# Patient Record
Sex: Female | Born: 1975 | Race: Black or African American | Hispanic: No | Marital: Single | Smoking: Current every day smoker
Health system: Southern US, Community
[De-identification: ages and names within clinical notes are randomized; demographics above are authoritative.]

## PROBLEM LIST (undated history)

## (undated) HISTORY — PX: OTHER SURGICAL HISTORY: SHX169

---

## 2007-10-18 ENCOUNTER — Observation Stay: Payer: Self-pay | Admitting: Obstetrics and Gynecology

## 2007-11-11 ENCOUNTER — Ambulatory Visit: Payer: Self-pay | Admitting: Obstetrics and Gynecology

## 2007-11-14 ENCOUNTER — Inpatient Hospital Stay: Payer: Self-pay | Admitting: Obstetrics and Gynecology

## 2008-10-11 ENCOUNTER — Emergency Department: Payer: Self-pay | Admitting: Emergency Medicine

## 2010-03-06 ENCOUNTER — Emergency Department: Payer: Self-pay | Admitting: Emergency Medicine

## 2010-10-17 ENCOUNTER — Ambulatory Visit
Admission: RE | Admit: 2010-10-17 | Discharge: 2010-10-17 | Payer: Self-pay | Source: Home / Self Care | Attending: Internal Medicine | Admitting: Internal Medicine

## 2010-10-17 DIAGNOSIS — J309 Allergic rhinitis, unspecified: Secondary | ICD-10-CM | POA: Insufficient documentation

## 2010-10-17 DIAGNOSIS — H6980 Other specified disorders of Eustachian tube, unspecified ear: Secondary | ICD-10-CM | POA: Insufficient documentation

## 2010-11-06 NOTE — Assessment & Plan Note (Signed)
Summary: SORE THROAT/JBB   Vital Signs:  Patient Profile:   35 Years Old Female CC:      Sore Throat Height:     63.5 inches Weight:      107 pounds BMI:     18.72 O2 Sat:      100 % O2 treatment:    Room Air Temp:     97.2 degrees F oral Pulse rate:   80 / minute Pulse rhythm:   regular Resp:     18 per minute BP sitting:   131 / 87  (right arm)  Pt. in pain?   yes    Location:   neck    Intensity:   6    Type:       aching  Vitals Entered By: Levonne Spiller EMT-P (October 17, 2010 4:50 PM)              Is Patient Diabetic? No  Does patient need assistance? Functional Status Self care Ambulation Normal Comments Pt. is smoker. 2 cigs per day.      Current Allergies: No known allergies History of Present Illness Chief Complaint: Sore Throat x 1 week History of Present Illness: patient with allergies but nasal discharge has become more yellow lately.  sore throat is more "burning" today than previous. 36 yo daughter was put on omnicef for ear infection yesterday. patient denies f/c, aches, fatigue, face pain  REVIEW OF SYSTEMS Constitutional Symptoms      Denies fever, chills, night sweats, weight loss, weight gain, and fatigue.  Eyes       Denies change in vision, eye pain, eye discharge, glasses, contact lenses, and eye surgery. Ear/Nose/Throat/Mouth       Complains of ear pain, frequent runny nose, sinus problems, sore throat, and hoarseness.      Denies hearing loss/aids, change in hearing, ear discharge, dizziness, and frequent nose bleeds.      Comments: mild right ear pressure. Respiratory       Complains of dry cough and shortness of breath.      Denies productive cough, wheezing, asthma, bronchitis, and emphysema/COPD.      Comments: chronic mild sob Cardiovascular       Denies murmurs, chest pain, and tires easily with exhertion.    Gastrointestinal       Denies stomach pain, nausea/vomiting, diarrhea, constipation, blood in bowel movements, and  indigestion. Genitourniary       Denies painful urination, blood or discharge from vagina, kidney stones, and loss of urinary control. Neurological       Denies paralysis, seizures, and fainting/blackouts. Musculoskeletal       Denies muscle pain, joint pain, joint stiffness, decreased range of motion, redness, swelling, muscle weakness, and gout.  Skin       Denies bruising, unusual mles/lumps or sores, and hair/skin or nail changes.  Psych       Denies mood changes, temper/anger issues, anxiety/stress, speech problems, depression, and sleep problems.  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 no sinus tenderness Eyes: conjunctivae and lids normal Ears: non-mobile TMR Nasal: pale, boggy, swollen nasal turbinates Oral/Pharynx: tongue normal, posterior pharynx without erythema or exudate. marked dental caries and fractures Neck: neck supple,  trachea midline, no nodes Chest/Lungs: no rales, wheezes, or rhonchi bilateral, breath sounds equal  without effort Neurological: grossly intact and non-focal Skin: no obvious rashes MSE: oriented to time, place, and person Assessment New Problems: ALLERGIC RHINITIS (ICD-477.9) DYSFUNCTION OF EUSTACHIAN TUBE (ICD-381.81) UPPER RESPIRATORY INFECTION, ACUTE (ICD-465.9)   Plan  The patient and/or caregiver has been counseled thoroughly with regard to medications prescribed including dosage, schedule, interactions, rationale for use, and possible side effects and they verbalize understanding.  Diagnoses and expected course of recovery discussed and will return if not improved as expected or if the condition worsens. Patient and/or caregiver verbalized understanding.   Patient Instructions: 1)  rapid  strep test offered and declined. 2)  generic claritin D twice daily 3)  afrin generic 2 sprays in each nostril every 12 hours for 3 days maximum followed by 3 days of non-use. Continue cycle as needed to control nasal or ear congestion  4)  Take 650-1000mg  of Tylenol every 4-6 hours as needed for relief of pain or comfort of fever AVOID taking more than 4000mg   in a 24 hour period (can cause liver damage in higher doses). 5)  Take 400-600mg  of Ibuprofen (Advil, Motrin) with food every 4-6 hours as needed for relief of pain or comfort of fever. 6)  gargles, lozenges. 7)  steam. 8)  check temps, return if worse or not well in 9 days.

## 2010-12-26 IMAGING — US US OB < 14 WEEKS - US OB TV
1 series · 17 of 28 positions shown · non-contrast
Comparison: none

REASON FOR EXAM: 15 hrs of vaginal bleeding and mid abd pain
COMMENTS:

[Series 1: us ob < 14 weeks - us ob tv · 17 of 39 slices shown]
[im 1/39]
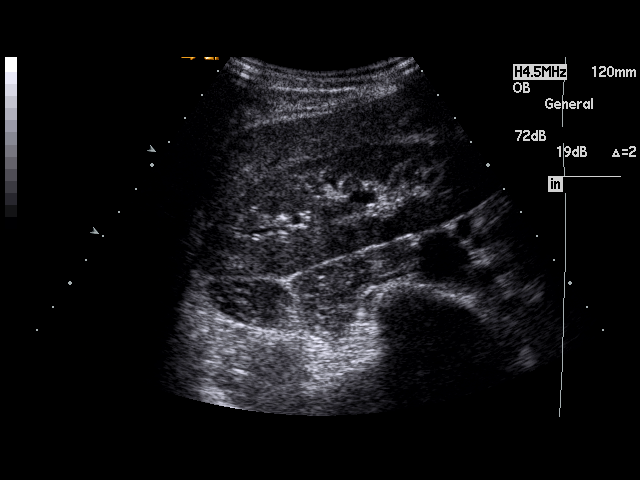
[im 3/39]
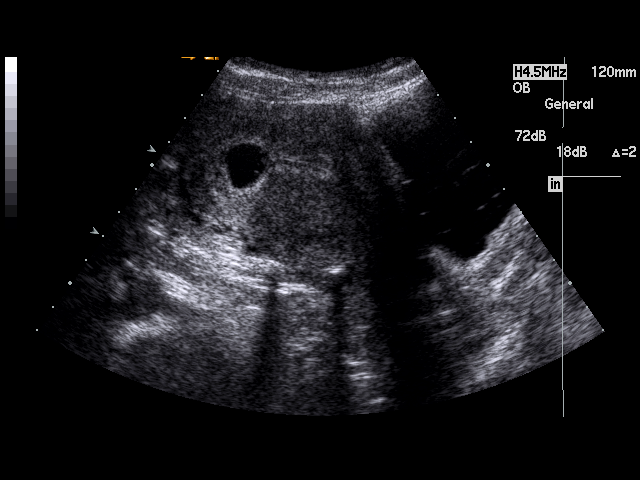
[im 6/39]
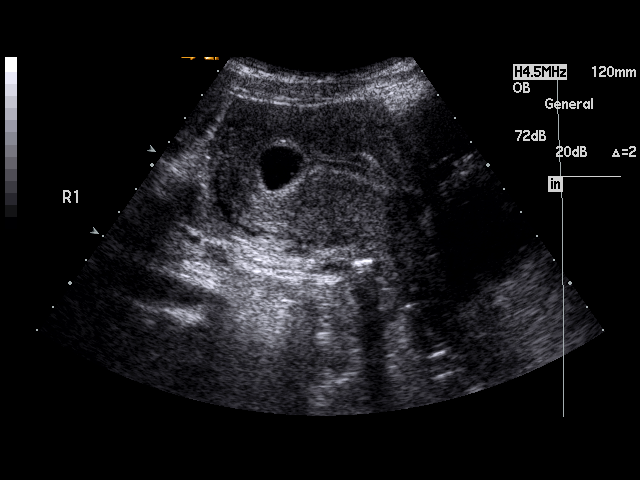
[im 8/39]
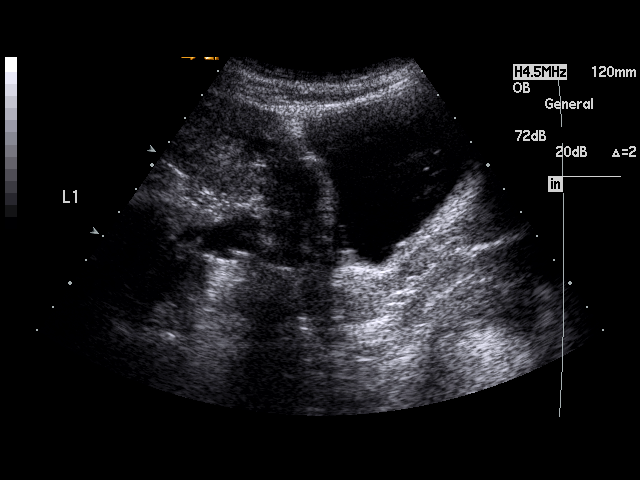
[im 10/39]
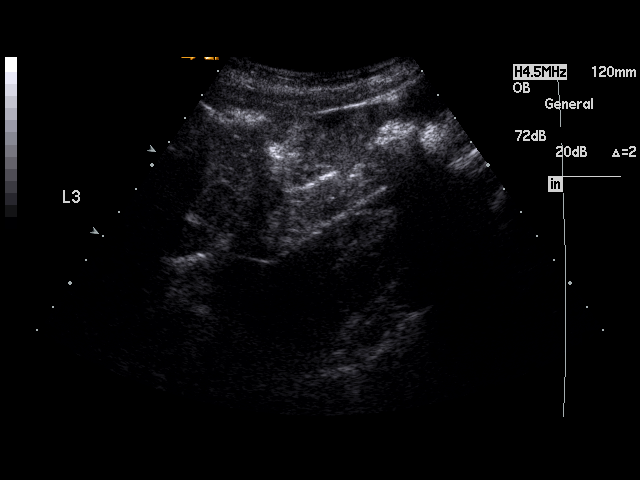
[im 13/39]
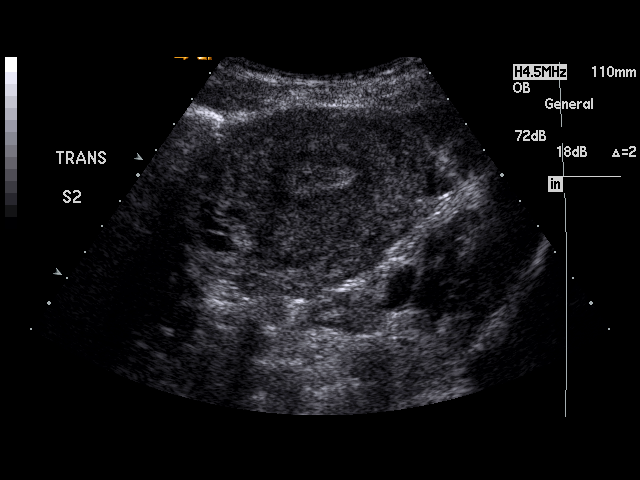
[im 15/39]
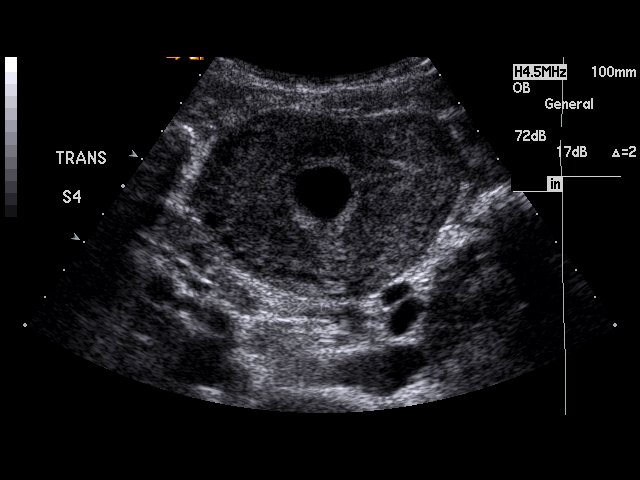
[im 17/39]
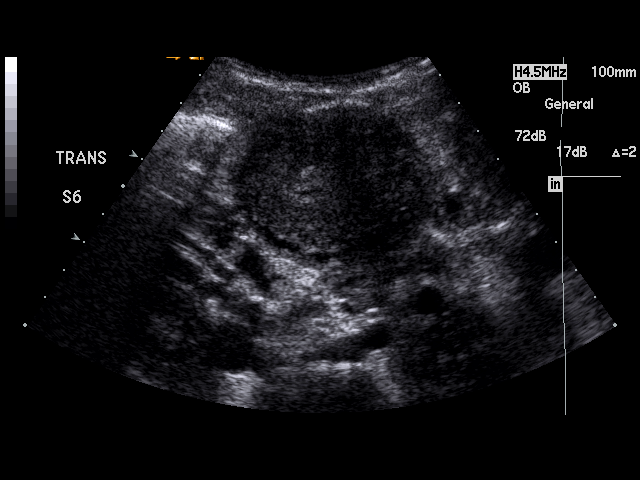
[im 20/39]
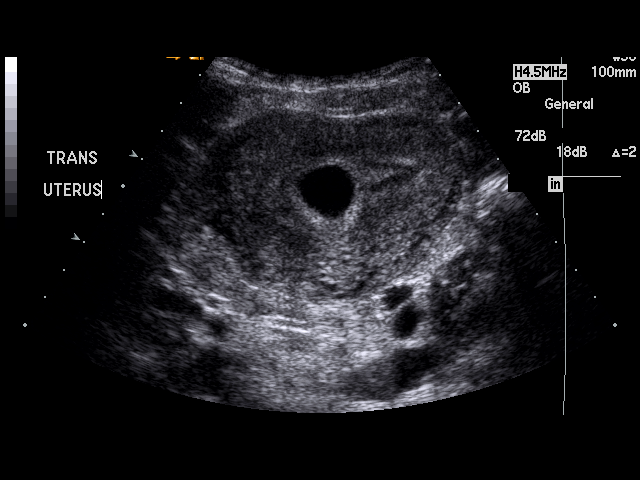
[im 22/39]
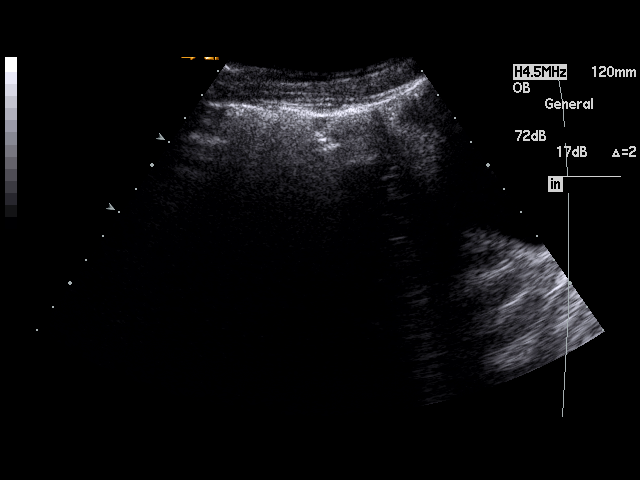
[im 24/39]
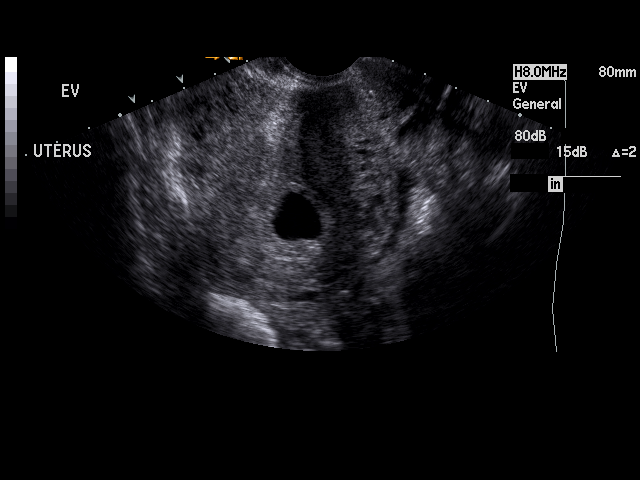
[im 26/39]
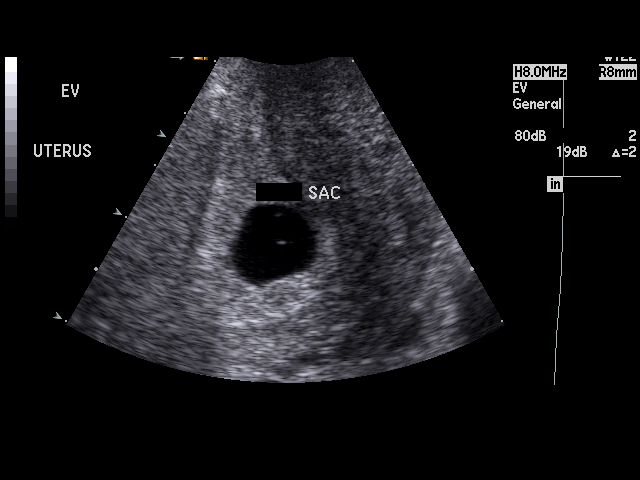
[im 29/39]
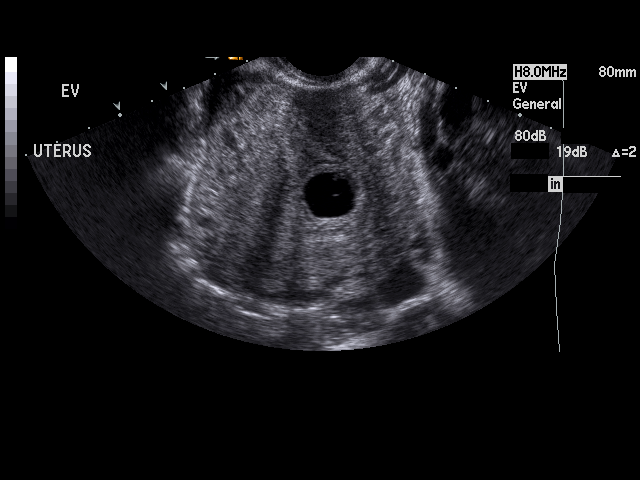
[im 31/39]
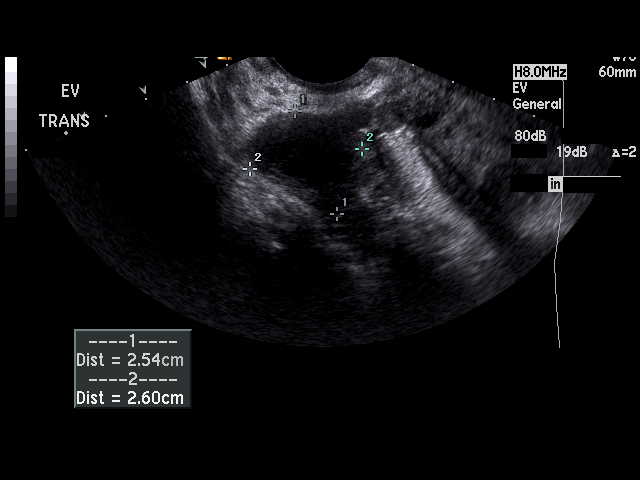
[im 33/39]
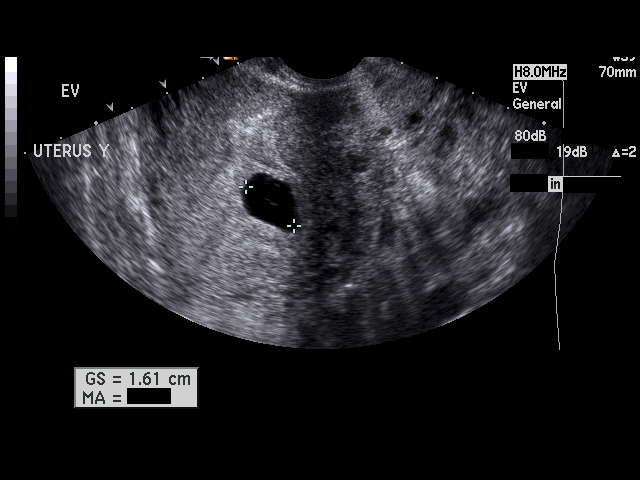
[im 36/39]
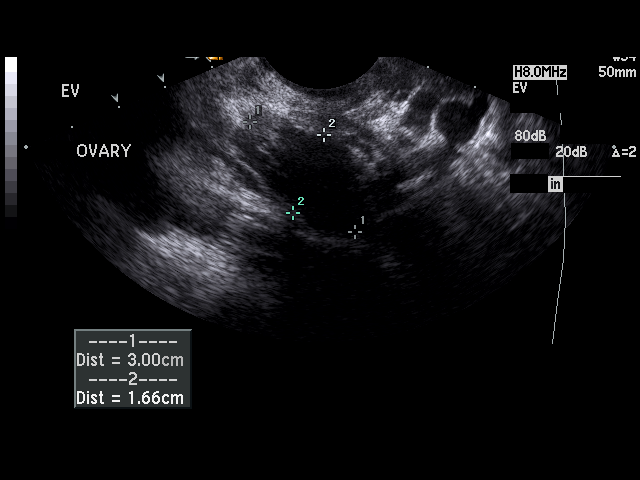
[im 39/39]
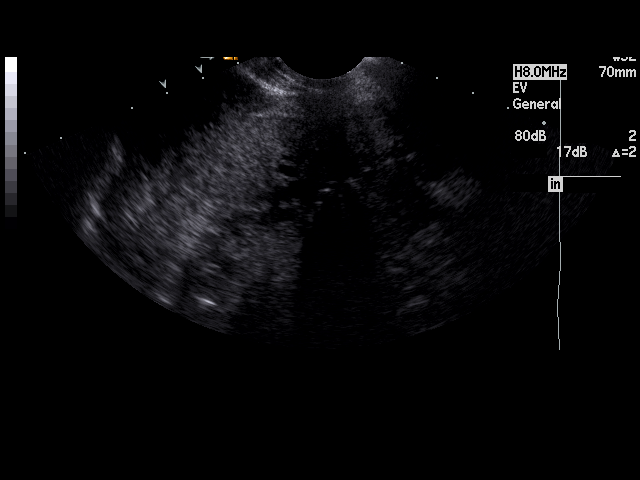

[17 of 28 positions shown; findings below may reference images not displayed]

PROCEDURE:     US  - US OB LESS THAN 14 WEEKS/W TRANS  - March 07, 2010  [DATE]

RESULT:     Transabdominal and endovaginal pelvic sonogram shows the kidneys
appear to be grossly normal. There is an intrauterine fluid collection
suggestive of a gestational sac. A Foley balloon is seen within the urinary
bladder. There appears to be a yolk sac contained within the gestational
sac. No fetal pole is identified. The ovaries appear to be normal in size
and echotexture. Gestational sac diameter demonstrates a mean measurement of
1.44 cm consistent with a 5 week-7 day intrauterine gestation. Again, no
fetal pole is evident.
IMPRESSION: There appears to be an intrauterine gestation without fetal
pole visualized at this point. Correlate clinically and with continued
laboratories assessment of quantitative beta-hCG. Follow-up ultrasound is
recommended to assess for fetal development and viability.

## 2017-08-24 ENCOUNTER — Ambulatory Visit: Payer: Self-pay | Admitting: Internal Medicine

## 2018-04-22 ENCOUNTER — Other Ambulatory Visit: Payer: Self-pay

## 2018-04-22 ENCOUNTER — Encounter: Payer: Self-pay | Admitting: Emergency Medicine

## 2018-04-22 ENCOUNTER — Emergency Department
Admission: EM | Admit: 2018-04-22 | Discharge: 2018-04-22 | Disposition: A | Discharge status: Home / Self Care | Payer: BLUE CROSS/BLUE SHIELD | Attending: Emergency Medicine | Admitting: Emergency Medicine

## 2018-04-22 ENCOUNTER — Emergency Department: Payer: BLUE CROSS/BLUE SHIELD

## 2018-04-22 DIAGNOSIS — Y92009 Unspecified place in unspecified non-institutional (private) residence as the place of occurrence of the external cause: Secondary | ICD-10-CM | POA: Insufficient documentation

## 2018-04-22 DIAGNOSIS — Y939 Activity, unspecified: Secondary | ICD-10-CM | POA: Insufficient documentation

## 2018-04-22 DIAGNOSIS — S99921A Unspecified injury of right foot, initial encounter: Secondary | ICD-10-CM | POA: Diagnosis present

## 2018-04-22 DIAGNOSIS — X58XXXA Exposure to other specified factors, initial encounter: Secondary | ICD-10-CM | POA: Diagnosis not present

## 2018-04-22 DIAGNOSIS — S92511A Displaced fracture of proximal phalanx of right lesser toe(s), initial encounter for closed fracture: Secondary | ICD-10-CM | POA: Diagnosis not present

## 2018-04-22 DIAGNOSIS — Y999 Unspecified external cause status: Secondary | ICD-10-CM | POA: Diagnosis not present

## 2018-04-22 MED ORDER — MELOXICAM 15 MG PO TABS: 15.0000 mg | ORAL_TABLET | Freq: Every day | ORAL | 0 refills | Status: DC

## 2018-04-22 NOTE — ED Provider Notes (Signed)
[name] Emergency Department Provider Note  ____________________________________________  Time seen: Approximately 11:41 PM  I have reviewed the triage vital signs and the nursing notes.   HISTORY  Chief Complaint Foot Injury    HPI Kristina Mckenzie is a 42 y.o. female who presents the emergency department complaining of pain and injury to the right little toe.  Per the patient, 1 of her dogs ran outside and she was chasing him through the house.  Patient reports that she stubbed her toe on a rolled up roll of carpet.  Patient reports immediate pain.  She was able to ambulate up placing the majority of the weight on the medial foot.  No other injury or complaint.  No medications prior to arrival.    History reviewed. No pertinent past medical history.  Patient Active Problem List   Diagnosis Date Noted  . DYSFUNCTION OF EUSTACHIAN TUBE 10/17/2010  . ALLERGIC RHINITIS 10/17/2010    History reviewed. No pertinent surgical history.  Prior to Admission medications   Medication Sig Start Date End Date Taking? Authorizing Provider  meloxicam (MOBIC) 15 MG tablet Take 1 tablet (15 mg total) by mouth daily. 04/22/18   Cuthriell, Charline Bills, PA-C    Allergies Patient has no allergy information on record.  No family history on file.  Social History Social History   Tobacco Use  . Smoking status: Not on file  Substance Use Topics  . Alcohol use: Not on file  . Drug use: Not on file     Review of Systems  Constitutional: No fever/chills Eyes: No visual changes.  Cardiovascular: no chest pain. Respiratory: no cough. No SOB. Gastrointestinal: No abdominal pain.  No nausea, no vomiting.   Musculoskeletal: Positive for pain and injury to the fifth digit of the right foot. Skin: Negative for rash, abrasions, lacerations, ecchymosis. Neurological: Negative for headaches, focal weakness or numbness. 10-point ROS otherwise  negative.  ____________________________________________   PHYSICAL EXAM:  VITAL SIGNS: ED Triage Vitals  Enc Vitals Group     BP 04/22/18 2144 111/71     Pulse Rate 04/22/18 2144 95     Resp 04/22/18 2144 18     Temp 04/22/18 2144 99 F (37.2 C)     Temp Source 04/22/18 2144 Oral     SpO2 04/22/18 2144 97 %     Weight 04/22/18 2143 103 lb (46.7 kg)     Height 04/22/18 2143 5\' 3"  (1.6 m)     Head Circumference --      Peak Flow --      Pain Score 04/22/18 2144 8     Pain Loc --      Pain Edu? --      Excl. in [place]? --      Constitutional: Alert and oriented. Well appearing and in no acute distress. Eyes: Conjunctivae are normal. PERRL. EOMI. Head: Atraumatic. Neck: No stridor.    Cardiovascular: Normal rate, regular rhythm. Normal S1 and S2.  Good peripheral circulation. Respiratory: Normal respiratory effort without tachypnea or retractions. Lungs CTAB. Good air entry to the bases with no decreased or absent breath sounds. Musculoskeletal: Full range of motion to all extremities. No gross deformities appreciated.  There is deformity, edema, ecchymosis noted to the little digit of the right foot.  No other visible abnormality.  Patient is extremely tender to palpation of the proximal phalanx with no palpable abnormality.  Sensation and cap refill intact distally.  No other tenderness to palpation of the osseous  structures of the foot. Neurologic:  Normal speech and language. No gross focal neurologic deficits are appreciated.  Skin:  Skin is warm, dry and intact. No rash noted. Psychiatric: Mood and affect are normal. Speech and behavior are normal. Patient exhibits appropriate insight and judgement.   ____________________________________________   LABS (all labs ordered are listed, but only abnormal results are displayed)  Labs Reviewed - No data to display ____________________________________________  EKG   ____________________________________________  RADIOLOGY I  personally viewed and evaluated these images as part of my medical decision making, as well as reviewing the written report by the radiologist.  I concur with radiologist finding of comminuted, oblique fracture that is mildly displaced to the proximal phalanx little toe right foot.  Dg Foot Complete Right  Result Date: 04/22/2018 CLINICAL DATA:  Right 5th toe pain and swelling to RT foot states she tripped over a rug at home. No obvious deformity noted, pt co pain on movement and ambulation. EXAM: RIGHT FOOT COMPLETE - 3+ VIEW COMPARISON:  None. FINDINGS: Mildly comminuted primarily oblique fracture of the proximal phalanx of the small toe. The fracture extends from the lateral-proximal metaphysis to the medial-distal metaphysis, also with a transverse component extending from the fracture plane to the medial cortex in the midshaft. There is up to about 1 mm of displacement. No additional fractures are identified. The flattened appearance of the longitudinal arch of the foot may simply be projectional on the lateral view, which was not obtained with the patient standing. IMPRESSION: 1. Mildly comminuted oblique fracture of the proximal phalanx small toe. Electronically Signed   By: Van Clines M.D.   On: 04/22/2018 22:21    ____________________________________________    PROCEDURES  Procedure(s) performed:    Procedures    Medications - No data to display   ____________________________________________   INITIAL IMPRESSION / ASSESSMENT AND PLAN / ED COURSE  Pertinent labs & imaging results that were available during my care of the patient were reviewed by me and considered in my medical decision making (see chart for details).  Review of the [place] CSRS was performed in accordance of the [place] prior to dispensing any controlled drugs.      Patient's diagnosis is consistent with fracture of the proximal phalanx of the fifth digit right foot.  Patient presented to the emergency  department with injury and pain to the little digit of the right foot.  Exam revealed mild edema and tenderness to palpation.  X-ray reveals comminuted, oblique fracture.  Patient is given postop shoe and crutches for ambulation.  Meloxicam will be prescribed for symptom control.  Patient will follow with podiatry.. Patient is given ED precautions to return to the ED for any worsening or new symptoms.     ____________________________________________  FINAL CLINICAL IMPRESSION(S) / ED DIAGNOSES  Final diagnoses:  Closed displaced fracture of proximal phalanx of lesser toe of right foot, initial encounter      NEW MEDICATIONS STARTED DURING THIS VISIT:  ED Discharge Orders        Ordered    meloxicam (MOBIC) 15 MG tablet  Daily     04/22/18 2310          This chart was dictated using voice recognition software/Dragon. Despite best efforts to proofread, errors can occur which can change the meaning. Any change was purely unintentional.    Darletta Moll, PA-C 04/22/18 [place], [place], MD 04/25/18 2200

## 2018-04-22 NOTE — ED Triage Notes (Signed)
Reports going through house quickly and hit right foot on rolled up carpet.

## 2018-04-22 NOTE — ED Notes (Signed)
Pt in with co right 5th toe pain states she tripped over a rug at home. No obvious deformity noted, pt co pain on movement and ambulation.

## 2018-04-22 NOTE — ED Notes (Signed)
Patient discharged to home per MD order. Patient in stable condition, and deemed medically cleared by ED provider for discharge. Discharge instructions reviewed with patient/family using "Teach Back"; verbalized understanding of medication education and administration, and information about follow-up care. Denies further concerns. ° °

## 2018-04-29 ENCOUNTER — Ambulatory Visit: Payer: Self-pay | Admitting: Podiatry

## 2019-02-09 ENCOUNTER — Ambulatory Visit (INDEPENDENT_AMBULATORY_CARE_PROVIDER_SITE_OTHER): Payer: BLUE CROSS/BLUE SHIELD | Admitting: Obstetrics & Gynecology

## 2019-02-09 ENCOUNTER — Encounter: Payer: Self-pay | Admitting: Obstetrics & Gynecology

## 2019-02-09 ENCOUNTER — Other Ambulatory Visit: Payer: Self-pay

## 2019-02-09 VITALS — BP 100/60 | Ht 63.0 in | Wt 104.0 lb

## 2019-02-09 DIAGNOSIS — D219 Benign neoplasm of connective and other soft tissue, unspecified: Secondary | ICD-10-CM

## 2019-02-09 DIAGNOSIS — N920 Excessive and frequent menstruation with regular cycle: Secondary | ICD-10-CM | POA: Diagnosis not present

## 2019-02-09 NOTE — Patient Instructions (Signed)

## 2019-02-09 NOTE — Progress Notes (Signed)
Uterine Fibroids Patient is a 43 yo [id number] F who presents with uterine fibroids as suggested by her PCP and by her sx's.  She has had known fibroids from her pregnancy days (small, last pregnancy 11 years ago). Periods used to be regular every 26 days, lasting 5 days.  Over the last 2 years they have worsened to every 30-32 days but heavy, 8 days flow, crampy, associated with nausea and headache, and even has had to miss work because of them.  Dysmenorrhea:moderate, occurring throughout menses. Cyclic symptoms include headache and neausea. No intermenstrual bleeding, spotting, or discharge.  Menarche age 72 3 CS, one VBAC Not currently sexually active Contraception is abstinance Does not desire future pregnancy  Last PAP 01/04/2019 at PCP, has had normal PAPs over years  PMHx: She  has no past medical history on file. Also,  has a past surgical history that includes csection., family history includes Cancer in her maternal grandmother; Diabetes in her maternal grandmother.,  reports that she has been smoking. She has never used smokeless tobacco. She reports current drug use. Drug: Marijuana. She reports that she does not drink alcohol.   Works at [employer]  She has a current medication list which includes the following prescription(s): meloxicam. Also, has no allergies on file.  Review of Systems  Constitutional: Negative for chills, fever and malaise/fatigue.  HENT: Negative for congestion, sinus pain and sore throat.   Eyes: Negative for blurred vision and pain.  Respiratory: Negative for cough and wheezing.   Cardiovascular: Negative for chest pain and leg swelling.  Gastrointestinal: Negative for abdominal pain, constipation, diarrhea, heartburn, nausea and vomiting.  Genitourinary: Negative for dysuria, frequency, hematuria and urgency.  Musculoskeletal: Negative for back pain, joint pain, myalgias and neck pain.  Skin: Negative for itching and rash.   Neurological: Negative for dizziness, tremors and weakness.  Endo/Heme/Allergies: Does not bruise/bleed easily.  Psychiatric/Behavioral: Negative for depression. The patient is not nervous/anxious and does not have insomnia.     Objective: BP 100/60   Ht 5\' 3"  (1.6 m)   Wt 104 lb (47.2 kg)   LMP 01/14/2019   BMI 18.42 kg/m  Physical Exam Constitutional:      General: She is not in acute distress.    Appearance: She is well-developed.  Genitourinary:     Pelvic exam was performed with patient supine.     Vagina, cervix and rectum normal.     No lesions in the vagina.     No vaginal bleeding.     No cervical polyp.     Uterus is enlarged, irregular and mobile.     Uterine mass present.    Uterus is midaxial and globular.     No right or left adnexal mass present.     Right adnexa not tender.     Left adnexa not tender.     Genitourinary Comments: 12 week size uterus  HENT:     Head: Normocephalic and atraumatic. No laceration.     Right Ear: Hearing normal.     Left Ear: Hearing normal.     Mouth/Throat:     Pharynx: Uvula midline.  Eyes:     Pupils: Pupils are equal, round, and reactive to light.  Neck:     Musculoskeletal: Normal range of motion and neck supple.     Thyroid: No thyromegaly.  Cardiovascular:     Rate and Rhythm: Normal rate and regular rhythm.     Heart sounds: No murmur. No friction  rub. No gallop.   Pulmonary:     Effort: Pulmonary effort is normal. No respiratory distress.     Breath sounds: Normal breath sounds. No wheezing.  Abdominal:     General: Bowel sounds are normal. There is no distension.     Palpations: Abdomen is soft.     Tenderness: There is no abdominal tenderness. There is no rebound.  Musculoskeletal: Normal range of motion.  Neurological:     Mental Status: She is alert and oriented to person, place, and time.     Cranial Nerves: No cranial nerve deficit.  Skin:    General: Skin is warm and dry.  Psychiatric:         Judgment: Judgment normal.  Vitals signs reviewed.     ASSESSMENT/PLAN:  New problem with Menorrhagia; Fibroid growth  Problem List Items Addressed This Visit      Other   Menorrhagia with regular cycle - Primary   Relevant Orders   US PELVIC COMPLETE WITH TRANSVAGINAL   Fibroid   Relevant Orders   US PELVIC COMPLETE WITH TRANSVAGINAL    Fibroid treatment such as [place], Lupron, Myomectomy, and Hysterectomy discussed in detail, with the pros and cons of each choice counseled.  No treatment as an option also discussed, as well as control of symptoms alone with hormone therapy. Information provided to the patient.  A total of 60 minutes were spent face-to-face with the patient during this encounter and over half of that time dealt with counseling and coordination of care.  Barnett Applebaum, MD, [facility], [facility] 02/09/2019  10:05 AM

## 2019-02-10 IMAGING — DX DG FOOT COMPLETE 3+V*R*
3 series · 3 of 3 positions shown · non-contrast
Comparison: None.

CLINICAL DATA: Right 5th toe pain and swelling to RT foot states
she tripped over a rug at home. No obvious deformity noted, pt co
pain on movement and ambulation.

EXAM:
RIGHT FOOT COMPLETE - 3+ VIEW

[foot ap]
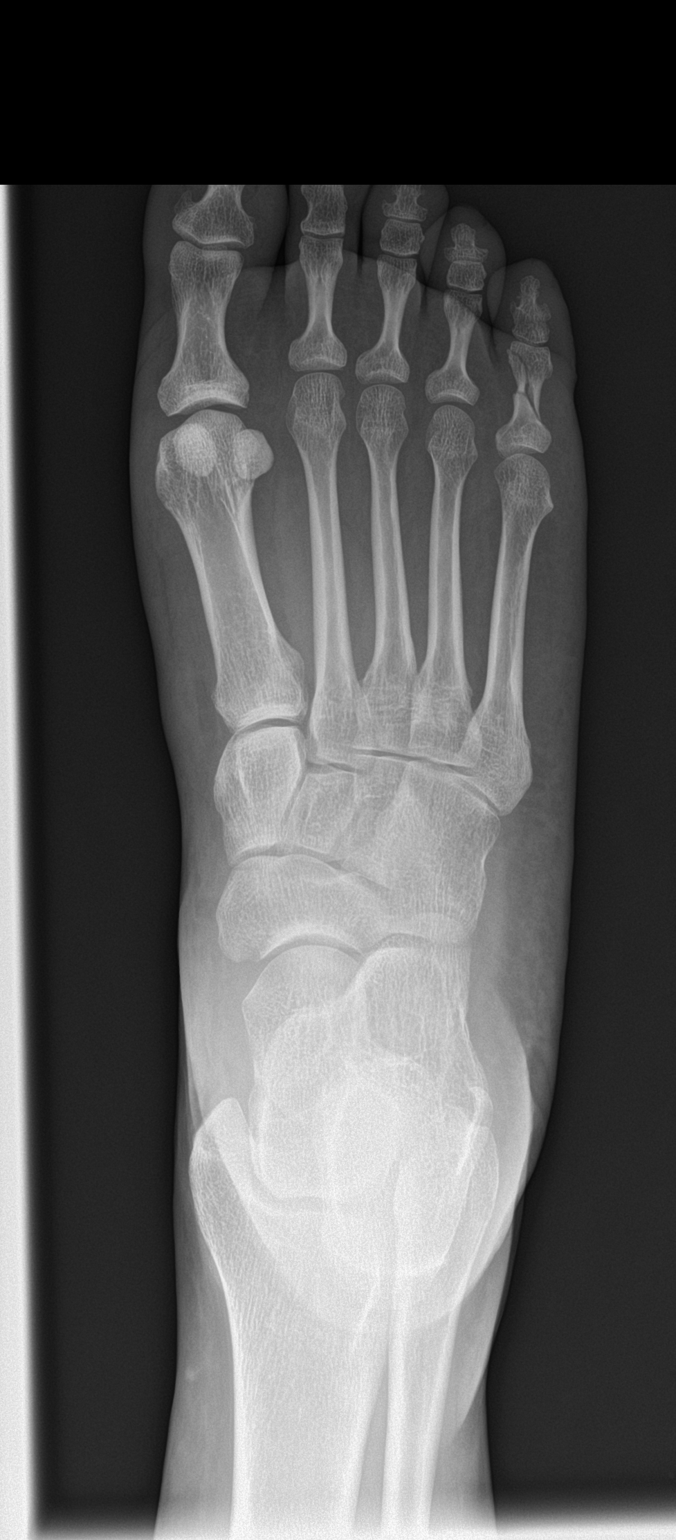

[foot obl]
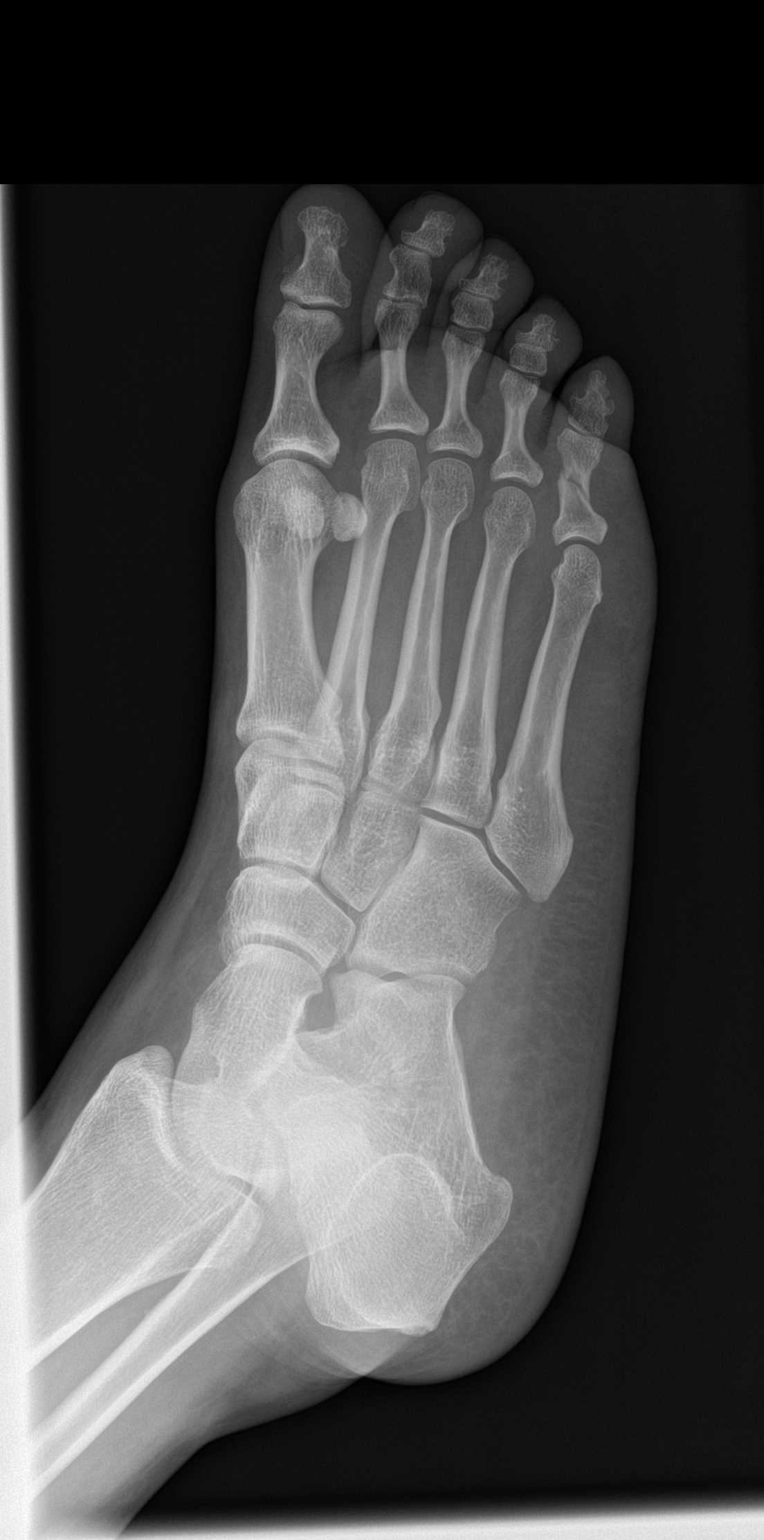

[foot lat]
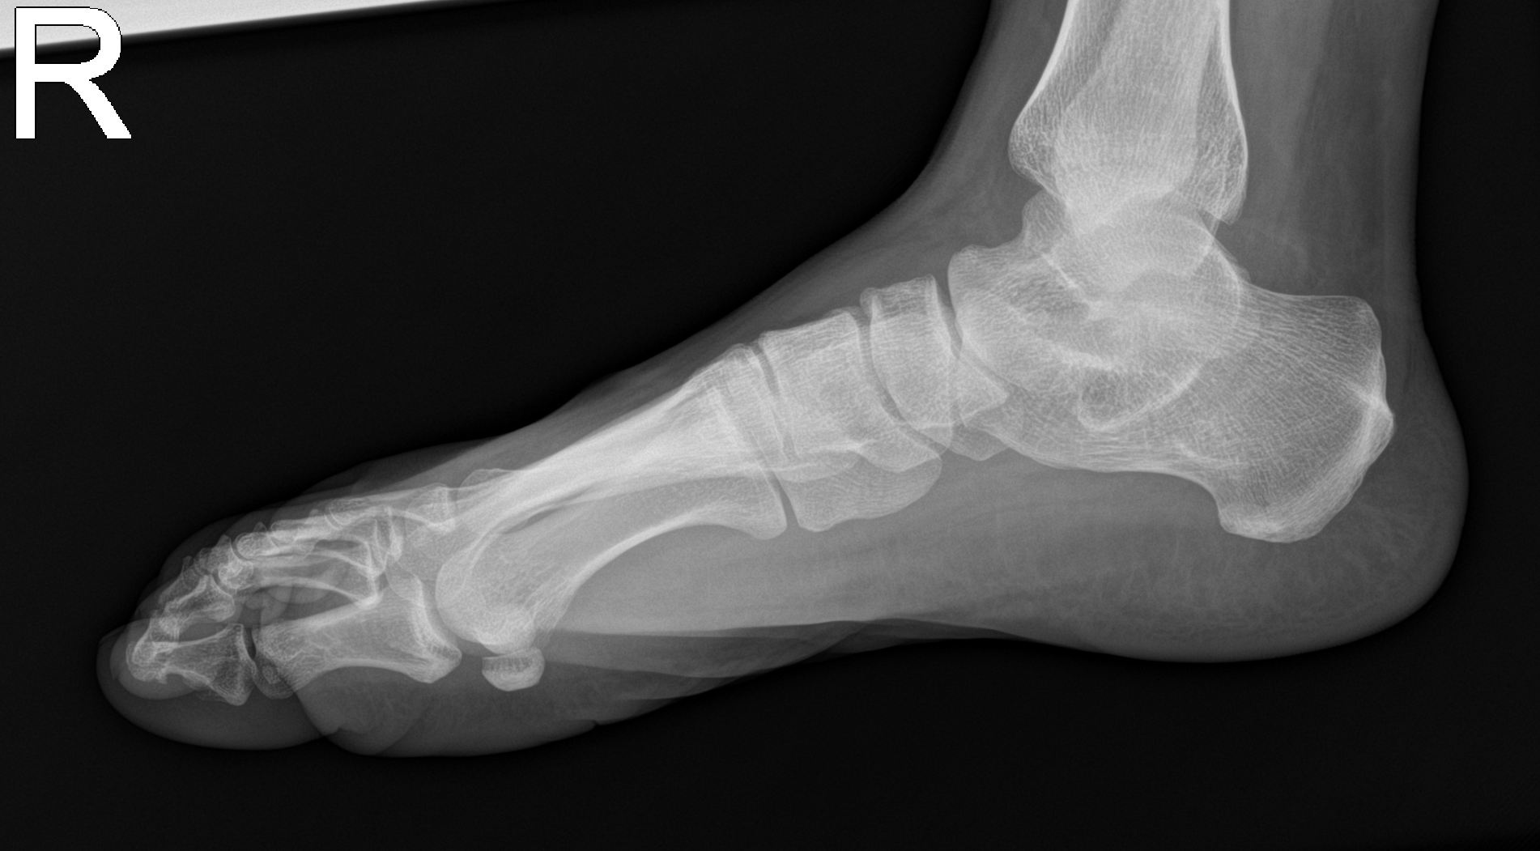

[3 of 3 positions shown; findings below may reference images not displayed]

FINDINGS: Mildly comminuted primarily oblique fracture of the proximal phalanx
of the small toe. The fracture extends from the lateral-proximal
metaphysis to the medial-distal metaphysis, also with a transverse
component extending from the fracture plane to the medial cortex in
the midshaft. There is up to about 1 mm of displacement. No
additional fractures are identified.

The flattened appearance of the longitudinal arch of the foot may
simply be projectional on the lateral view, which was not obtained
with the patient standing.
IMPRESSION: 1. Mildly comminuted oblique fracture of the proximal phalanx small
toe.

## 2019-02-14 ENCOUNTER — Ambulatory Visit (INDEPENDENT_AMBULATORY_CARE_PROVIDER_SITE_OTHER): Payer: BLUE CROSS/BLUE SHIELD

## 2019-02-14 ENCOUNTER — Encounter: Payer: Self-pay | Admitting: Obstetrics & Gynecology

## 2019-02-14 ENCOUNTER — Ambulatory Visit (INDEPENDENT_AMBULATORY_CARE_PROVIDER_SITE_OTHER): Payer: BLUE CROSS/BLUE SHIELD | Admitting: Obstetrics & Gynecology

## 2019-02-14 ENCOUNTER — Other Ambulatory Visit: Payer: Self-pay

## 2019-02-14 VITALS — BP 100/60 | Ht 63.0 in | Wt 104.0 lb

## 2019-02-14 DIAGNOSIS — D219 Benign neoplasm of connective and other soft tissue, unspecified: Secondary | ICD-10-CM

## 2019-02-14 DIAGNOSIS — D251 Intramural leiomyoma of uterus: Secondary | ICD-10-CM

## 2019-02-14 DIAGNOSIS — N920 Excessive and frequent menstruation with regular cycle: Secondary | ICD-10-CM

## 2019-02-14 DIAGNOSIS — N83202 Unspecified ovarian cyst, left side: Secondary | ICD-10-CM | POA: Diagnosis not present

## 2019-02-14 NOTE — Patient Instructions (Signed)
Levonorgestrel intrauterine device (IUD) What is this medicine? LEVONORGESTREL IUD (LEE voe nor jes trel) is a contraceptive (birth control) device. The device is placed inside the uterus by a healthcare professional. It is used to prevent pregnancy. This device can also be used to treat heavy bleeding that occurs during your period. This medicine may be used for other purposes; ask your health care provider or pharmacist if you have questions. COMMON BRAND NAME(S): Minette Headland What should I tell my health care provider before I take this medicine? They need to know if you have any of these conditions: -abnormal Pap smear -cancer of the breast, uterus, or cervix -diabetes -endometritis -genital or pelvic infection now or in the past -have more than one sexual partner or your partner has more than one partner -heart disease -history of an ectopic or tubal pregnancy -immune system problems -IUD in place -liver disease or tumor -problems with blood clots or take blood-thinners -seizures -use intravenous drugs -uterus of unusual shape -vaginal bleeding that has not been explained -an unusual or allergic reaction to levonorgestrel, other hormones, silicone, or polyethylene, medicines, foods, dyes, or preservatives -pregnant or trying to get pregnant -breast-feeding How should I use this medicine? This device is placed inside the uterus by a health care professional. Talk to your pediatrician regarding the use of this medicine in children. Special care may be needed. Overdosage: If you think you have taken too much of this medicine contact a poison control center or emergency room at once. NOTE: This medicine is only for you. Do not share this medicine with others. What if I miss a dose? This does not apply. Depending on the brand of device you have inserted, the device will need to be replaced every 3 to 5 years if you wish to continue using this type of birth control.  What may interact with this medicine? Do not take this medicine with any of the following medications: -amprenavir -bosentan -fosamprenavir This medicine may also interact with the following medications: -aprepitant -armodafinil -barbiturate medicines for inducing sleep or treating seizures -bexarotene -boceprevir -griseofulvin -medicines to treat seizures like carbamazepine, ethotoin, felbamate, oxcarbazepine, phenytoin, topiramate -modafinil -pioglitazone -rifabutin -rifampin -rifapentine -some medicines to treat HIV infection like atazanavir, efavirenz, indinavir, lopinavir, nelfinavir, tipranavir, ritonavir -St. John's wort -warfarin This list may not describe all possible interactions. Give your health care provider a list of all the medicines, herbs, non-prescription drugs, or dietary supplements you use. Also tell them if you smoke, drink alcohol, or use illegal drugs. Some items may interact with your medicine. What should I watch for while using this medicine? Visit your doctor or health care professional for regular check ups. See your doctor if you or your partner has sexual contact with others, becomes HIV positive, or gets a sexual transmitted disease. This product does not protect you against HIV infection (AIDS) or other sexually transmitted diseases. You can check the placement of the IUD yourself by reaching up to the top of your vagina with clean fingers to feel the threads. Do not pull on the threads. It is a good habit to check placement after each menstrual period. Call your doctor right away if you feel more of the IUD than just the threads or if you cannot feel the threads at all. The IUD may come out by itself. You may become pregnant if the device comes out. If you notice that the IUD has come out use a backup birth control method like condoms and call your  health care provider. Using tampons will not change the position of the IUD and are okay to use during your  period. This IUD can be safely scanned with magnetic resonance imaging (MRI) only under specific conditions. Before you have an MRI, tell your healthcare provider that you have an IUD in place, and which type of IUD you have in place. What side effects may I notice from receiving this medicine? Side effects that you should report to your doctor or health care professional as soon as possible: -allergic reactions like skin rash, itching or hives, swelling of the face, lips, or tongue -fever, flu-like symptoms -genital sores -high blood pressure -no menstrual period for 6 weeks during use -pain, swelling, warmth in the leg -pelvic pain or tenderness -severe or sudden headache -signs of pregnancy -stomach cramping -sudden shortness of breath -trouble with balance, talking, or walking -unusual vaginal bleeding, discharge -yellowing of the eyes or skin Side effects that usually do not require medical attention (report to your doctor or health care professional if they continue or are bothersome): -acne -breast pain -change in sex drive or performance -changes in weight -cramping, dizziness, or faintness while the device is being inserted -headache -irregular menstrual bleeding within first 3 to 6 months of use -nausea This list may not describe all possible side effects. Call your doctor for medical advice about side effects. You may report side effects to FDA at 1-800-FDA-1088. Where should I keep my medicine? This does not apply. NOTE: This sheet is a summary. It may not cover all possible information. If you have questions about this medicine, talk to your doctor, pharmacist, or health care provider.  2019 Elsevier/Gold Standard (2016-07-03 14:14:56)   Endometrial Ablation Endometrial ablation is a procedure that destroys the thin inner layer of the lining of the uterus (endometrium). This procedure may be done:  To stop heavy periods.  To stop bleeding that is causing anemia.   To control irregular bleeding.  To treat bleeding caused by small tumors (fibroids) in the endometrium. This procedure is often an alternative to major surgery, such as removal of the uterus and cervix (hysterectomy). As a result of this procedure:  You may not be able to have children. However, if you are premenopausal (you have not gone through menopause): ? You may still have a small chance of getting pregnant. ? You will need to use a reliable method of birth control after the procedure to prevent pregnancy.  You may stop having a menstrual period, or you may have only a small amount of bleeding during your period. Menstruation may return several years after the procedure. Tell a health care provider about:  Any allergies you have.  All medicines you are taking, including vitamins, herbs, eye drops, creams, and over-the-counter medicines.  Any problems you or family members have had with the use of anesthetic medicines.  Any blood disorders you have.  Any surgeries you have had.  Any medical conditions you have. What are the risks? Generally, this is a safe procedure. However, problems may occur, including:  A hole (perforation) in the uterus or bowel.  Infection of the uterus, bladder, or vagina.  Bleeding.  Damage to other structures or organs.  An air bubble in the lung (air embolus).  Problems with pregnancy after the procedure.  Failure of the procedure.  Decreased ability to diagnose cancer in the endometrium. What happens before the procedure?  You will have tests of your endometrium to make sure there are no pre-cancerous cells  or cancer cells present.  You may have an ultrasound of the uterus.  You may be given medicines to thin the endometrium.  Ask your health care provider about: ? Changing or stopping your regular medicines. This is especially important if you take diabetes medicines or blood thinners. ? Taking medicines such as aspirin and  ibuprofen. These medicines can thin your blood. Do not take these medicines before your procedure if your doctor tells you not to.  Plan to have someone take you home from the hospital or clinic. What happens during the procedure?   You will lie on an exam table with your feet and legs supported as in a pelvic exam.  To lower your risk of infection: ? Your health care team will wash or sanitize their hands and put on germ-free (sterile) gloves. ? Your genital area will be washed with soap.  An IV tube will be inserted into one of your veins.  You will be given a medicine to help you relax (sedative).  A surgical instrument with a light and camera (resectoscope) will be inserted into your vagina and moved into your uterus. This allows your surgeon to see inside your uterus.  Endometrial tissue will be removed using one of the following methods: ? Radiofrequency. This method uses a radiofrequency-alternating electric current to remove the endometrium. ? Cryotherapy. This method uses extreme cold to freeze the endometrium. ? Heated-free liquid. This method uses a heated saltwater (saline) solution to remove the endometrium. ? Microwave. This method uses high-energy microwaves to heat up the endometrium and remove it. ? Thermal balloon. This method involves inserting a catheter with a balloon tip into the uterus. The balloon tip is filled with heated fluid to remove the endometrium. The procedure may vary among health care providers and hospitals. What happens after the procedure?  Your blood pressure, heart rate, breathing rate, and blood oxygen level will be monitored until the medicines you were given have worn off.  As tissue healing occurs, you may notice vaginal bleeding for 4-6 weeks after the procedure. You may also experience: ? Cramps. ? Thin, watery vaginal discharge that is light pink or brown in color. ? A need to urinate more frequently than usual. ? Nausea.  Do not drive  for 24 hours if you were given a sedative.  Do not have sex or insert anything into your vagina until your health care provider approves. Summary  Endometrial ablation is done to treat the many causes of heavy menstrual bleeding.  The procedure may be done only after medications have been tried to control the bleeding.  Plan to have someone take you home from the hospital or clinic. This information is not intended to replace advice given to you by your health care provider. Make sure you discuss any questions you have with your health care provider. Document Released: 07/31/2004 Document Revised: 03/08/2018 Document Reviewed: 10/08/2016 Elsevier Interactive Patient Education  2019 [employer].

## 2019-02-14 NOTE — Progress Notes (Signed)
HPI: Pt has had change in periods and increasing pressure during cycles.  Desires intervention.  Prior h/o fibroids.  Ultrasound demonstrates fibroid and cyst, see below These findings are showing small fibroid and no other uterine anomaly  PMHx: She  has no past medical history on file. Also,  has a past surgical history that includes csection., family history includes Cancer in her maternal grandmother; Diabetes in her maternal grandmother.,  reports that she has been smoking. She has never used smokeless tobacco. She reports current drug use. Drug: Marijuana. She reports that she does not drink alcohol.  She has a current medication list which includes the following prescription(s): meloxicam. Also, has no allergies on file.  Review of Systems  All other systems reviewed and are negative.   Objective: BP 100/60    Ht 5\' 3"  (1.6 m)    Wt 104 lb (47.2 kg)    LMP 01/17/2019    BMI 18.42 kg/m   Physical examination Constitutional NAD, Conversant  Skin No rashes, lesions or ulceration.   Extremities: Moves all appropriately.  Normal ROM for age. No lymphadenopathy.  Neuro: Grossly intact  Psych: Oriented to PPT.  Normal mood. Normal affect.   US Pelvic Complete With Transvaginal  Result Date: 02/14/2019 Patient Name: Kristina Mckenzie DOB: 1976-06-03 MRN: [id number] ULTRASOUND REPORT Location: [facility] Date of Service: 02/14/2019 Indications:Pelvic Pain Findings: The uterus is anteverted cervix, retroverted uterus and measures 11.6 x 5.9 x 4.5cm. Echo texture is homogenous with evidence of focal mass. Within the uterus is one suspected fibroid measuring: Fibroid 1:  1.1 x 1.0 x 0.9c, (RT, IM) The Endometrium measures 10.9 mm. Right Ovary measures 2.4 x 1.7 x 1.4 cm. It is normal in appearance. Left Ovary measures 4.4 x 3.6 x 2.6 cm with simple cyst measuring 3.2 x 3.1 x 2.4cm. Survey of the adnexa demonstrates no adnexal masses. Dilated left adnexal vessels. Possible pelvic congestion.  There is no free fluid in the cul de sac. Impression: 1. Single intramural fibroid 2. Simple left ovarian cyst 3.2cm. 3. Dilated left adnexal vessels. Recommendations: 1.Clinical correlation with the patient's History and Physical Exam. Vita Barley, RT Review of ULTRASOUND.    I have personally reviewed images and report of recent ultrasound done at [facility].    Plan of management to be discussed with patient. Barnett Applebaum, MD, [facility], [facility] 02/14/2019  11:31 AM    Assessment:  Menorrhagia with regular cycle  Fibroid    1 cm, small, low likelihood as cause for sx's Ovarian cyst    3 cm, monitor for sx's/growth  Mirena and Ablation options discussed (as well as other hormones modalities, and hysterectomy, and expectant management)   Patient was told that it is normal to have menstrual bleeding after an endometrial ablation, only about 80% of patients become amenorrheic, 10% of patients have normal or light periods, and 10% of patients have no change in their bleeding pattern and may need further intervention.  She was told she will observe her periods for a few months after her ablation to see what her periods will be like; it is recommended to wait until at least three months after the procedure before making conclusions about how periods are going to be like after an ablation.  Mirena pros and cons also discussed. Info gv on both. Pt to decide and schedule after a period.  A total of 15 minutes were spent face-to-face with the patient during this encounter and over half  of that time dealt with counseling and coordination of care.  Barnett Applebaum, MD, [facility], [facility] 02/14/2019  11:57 AM

## 2019-02-28 ENCOUNTER — Telehealth: Payer: Self-pay | Admitting: Obstetrics & Gynecology

## 2019-02-28 NOTE — Telephone Encounter (Signed)
Surgery Booking Request Patient Full Name:  Kristina Mckenzie  MRN: [id number]  DOB: [birth year]  Surgeon: Hoyt Koch, MD  Requested Surgery Date and Time: Any month, one week after period Primary Diagnosis AND Code: Menorrhagia Secondary Diagnosis and Code:  Surgical Procedure: IN OFFICE ENDOMETRIAL ABLATION w HYSTEROSCOPY L&D Notification: No Admission Status: OFFICE Length of Surgery: 30 min Special Case Needs: Minerva H&P: yes

## 2019-02-28 NOTE — Telephone Encounter (Signed)
Patient is calling wanting to schedule for an Ablation with Dr. Kenton Kingfisher. Please advise

## 2019-03-13 ENCOUNTER — Telehealth: Payer: Self-pay | Admitting: Obstetrics & Gynecology

## 2019-03-13 NOTE — Telephone Encounter (Signed)
No answer, calling restrictions, no v/m.

## 2019-03-15 NOTE — Telephone Encounter (Signed)
No answer, vm not set up.

## 2019-03-16 NOTE — Telephone Encounter (Signed)
No answer, vm not set up.

## 2019-03-17 NOTE — Telephone Encounter (Signed)
Spoke to the patient, it has been more than one week since her last cycle, and she is not sure when to expect the next. Patient agree to call back on the first day of her next cycle for scheduling. Ext given.

## 2019-03-27 NOTE — Telephone Encounter (Signed)
Patient is aware of H&P on 04/03/19 @ 4:10pm w Dr. Kenton Kingfisher, and IN OFFICE ENDOMETRIAL ABLATION w/ HYSTEROSCOPY ON 04/05/19 @ 8:00am w/ Dr. Kenton Kingfisher. Patient confirmed BCBS and no secondary insurance. Patient is aware of phone check-in/ mask/ no visitors at [facility]. Patient is aware she will need someone to drive her on 12/05/81.

## 2019-03-27 NOTE — Telephone Encounter (Signed)
Patient left voicemail message that her cycle started on Saturday. No answer, no v/m.

## 2019-03-28 NOTE — Telephone Encounter (Signed)
Pt's H&P is rescheduled for 04/26/19 @ 8:40am, and IN OFFICE ENDOMETRIAL ABLATION w/ HYSTEROSCOPY has been rescheduled for 04/28/19 @ 8:10am w/ Dr. Kenton Kingfisher.

## 2019-03-28 NOTE — Telephone Encounter (Signed)
Due to Minerva rep being unavailable on July 1, patient will be rescheduled for week of July 20. No answer, no v/m.

## 2019-04-03 ENCOUNTER — Encounter: Payer: BC Managed Care – PPO | Admitting: Obstetrics & Gynecology

## 2019-04-05 ENCOUNTER — Ambulatory Visit: Payer: BLUE CROSS/BLUE SHIELD

## 2019-04-18 ENCOUNTER — Telehealth: Payer: Self-pay | Admitting: Obstetrics & Gynecology

## 2019-04-18 NOTE — Telephone Encounter (Signed)
Attempted to reach the patient w/ insurance info. Lmtrc.

## 2019-04-24 ENCOUNTER — Telehealth: Payer: Self-pay | Admitting: Obstetrics & Gynecology

## 2019-04-24 NOTE — Telephone Encounter (Signed)
Attempted to return the patient's call. No answer, v/m is full.

## 2019-04-26 ENCOUNTER — Other Ambulatory Visit: Payer: Self-pay

## 2019-04-26 ENCOUNTER — Encounter: Payer: Self-pay | Admitting: Obstetrics & Gynecology

## 2019-04-26 ENCOUNTER — Ambulatory Visit (INDEPENDENT_AMBULATORY_CARE_PROVIDER_SITE_OTHER): Payer: BC Managed Care – PPO | Admitting: Obstetrics & Gynecology

## 2019-04-26 VITALS — BP 120/80 | Ht 63.0 in | Wt 101.0 lb

## 2019-04-26 DIAGNOSIS — N920 Excessive and frequent menstruation with regular cycle: Secondary | ICD-10-CM

## 2019-04-26 MED ORDER — HYDROCODONE-ACETAMINOPHEN 5-325 MG PO TABS: 1.0000 | ORAL_TABLET | Freq: Four times a day (QID) | ORAL | 0 refills | Status: DC | PRN

## 2019-04-26 MED ORDER — MISOPROSTOL 200 MCG PO TABS: 200.0000 ug | ORAL_TABLET | Freq: Once | ORAL | 0 refills | Status: DC

## 2019-04-26 MED ORDER — DIAZEPAM 5 MG PO TABS: 5.0000 mg | ORAL_TABLET | Freq: Once | ORAL | 0 refills | Status: AC

## 2019-04-26 MED ORDER — PROMETHAZINE HCL 25 MG PO TABS: 25.0000 mg | ORAL_TABLET | Freq: Once | ORAL | 0 refills | Status: DC

## 2019-04-26 MED ORDER — IBUPROFEN 600 MG PO TABS: 600.0000 mg | ORAL_TABLET | Freq: Four times a day (QID) | ORAL | 3 refills | Status: DC | PRN

## 2019-04-26 NOTE — Patient Instructions (Signed)
  Endometrial Ablation Pre-Procedural Instructions for Patient   You may have a light meal prior to coming to the office if desired.    You can plan on your appointment taking about 1 hour.  The actual procedure lasts for 1/2 hour but you will need to remain in the office for a short period after the procedure.    You may feel drowsy from the medication administered prior to and during the procedure. You should arrange for transportation to and from the office.    The following medications have been prescribed to you.  Please follow the doctor's instructions in taking these medications:  Day before Procedure    Cytotec 200 mg vaginally at bedtime    Ibuprofen 600 mg one by mouth three times a day for 2 days before procedure    Day of Procedure  Phenergan 25 mg  by mouth 1 hour before procedure Valium 5mg   1 by mouth 1 hour before procedure Norco 5/325 mg 2 by mouth 1 hour before procedure    And also 1 to 2 by mouth every 4-5 hours as needed for pain Ibuprofen 600 mg 1 by mouth every 8 hours as needed for pain

## 2019-04-26 NOTE — Progress Notes (Signed)
HISTORY AND PHYSICAL EXAM  HPI:  Kristina Mckenzie is a 43 y.o. [id number] No LMP recorded.; she is being admitted for surgery related to abnormal uterine bleeding.  Periods used to be regular every 26 days, lasting 5 days.  Over the last 2 years they have worsened to every 30-32 days but heavy, 8 days flow, crampy, associated with nausea and headache, and even has had to miss work because of them.  Dysmenorrhea:moderate, occurring throughout menses. Cyclic symptoms include headache and neausea. No intermenstrual bleeding, spotting, or discharge.  PMHx: No past medical history on file. Past Surgical History:  Procedure Laterality Date  . csection     Family History  Problem Relation Age of Onset  . Cancer Maternal Grandmother   . Diabetes Maternal Grandmother    Social History   Tobacco Use  . Smoking status: Current Every Day Smoker  . Smokeless tobacco: Never Used  Substance Use Topics  . Alcohol use: Never    Frequency: Never  . Drug use: Yes    Types: Marijuana    Current Outpatient Medications:  .  meloxicam (MOBIC) 15 MG tablet, Take 1 tablet (15 mg total) by mouth daily. (Patient not taking: Reported on 02/14/2019), Disp: 30 tablet, Rfl: 0 Allergies: Patient has no allergy information on record.  Review of Systems  Constitutional: Negative for chills, fever and malaise/fatigue.  HENT: Negative for congestion, sinus pain and sore throat.   Eyes: Negative for blurred vision and pain.  Respiratory: Negative for cough and wheezing.   Cardiovascular: Negative for chest pain and leg swelling.  Gastrointestinal: Negative for abdominal pain, constipation, diarrhea, heartburn, nausea and vomiting.  Genitourinary: Negative for dysuria, frequency, hematuria and urgency.  Musculoskeletal: Negative for back pain, joint pain, myalgias and neck pain.  Skin: Negative for itching and rash.  Neurological: Negative for dizziness, tremors and weakness.  Endo/Heme/Allergies: Does not  bruise/bleed easily.  Psychiatric/Behavioral: Negative for depression. The patient is not nervous/anxious and does not have insomnia.     Objective: There were no vitals taken for this visit. There were no vitals filed for this visit. Physical Exam Constitutional:      General: She is not in acute distress.    Appearance: She is well-developed.  HENT:     Head: Normocephalic and atraumatic. No laceration.     Right Ear: Hearing normal.     Left Ear: Hearing normal.     Mouth/Throat:     Pharynx: Uvula midline.  Eyes:     Pupils: Pupils are equal, round, and reactive to light.  Neck:     Musculoskeletal: Normal range of motion and neck supple.     Thyroid: No thyromegaly.  Cardiovascular:     Rate and Rhythm: Normal rate and regular rhythm.     Heart sounds: No murmur. No friction rub. No gallop.   Pulmonary:     Effort: Pulmonary effort is normal. No respiratory distress.     Breath sounds: Normal breath sounds. No wheezing.  Chest:     Breasts:        Right: No mass, skin change or tenderness.        Left: No mass, skin change or tenderness.  Abdominal:     General: Bowel sounds are normal. There is no distension.     Palpations: Abdomen is soft.     Tenderness: There is no abdominal tenderness. There is no rebound.  Musculoskeletal: Normal range of motion.  Neurological:  Mental Status: She is alert and oriented to person, place, and time.     Cranial Nerves: No cranial nerve deficit.  Skin:    General: Skin is warm and dry.  Psychiatric:        Judgment: Judgment normal.  Vitals signs reviewed.     Assessment: 1. Menorrhagia with regular cycle   Plan ablation  Patient was told that it is normal to have menstrual bleeding after an endometrial ablation, only about 80% of patients become amenorrheic, 10% of patients have normal or light periods, and 10% of patients have no change in their bleeding pattern and may need further intervention.  She was told she will  observe her periods for a few months after her ablation to see what her periods will be like; it is recommended to wait until at least three months after the procedure before making conclusions about how periods are going to be like after an ablation.  Medications discussed for before and after the procedure. Rx given.  Barnett Applebaum, MD, [facility], [facility] 04/26/2019  8:19 AM

## 2019-04-28 ENCOUNTER — Encounter: Payer: Self-pay | Admitting: Obstetrics & Gynecology

## 2019-04-28 ENCOUNTER — Ambulatory Visit (INDEPENDENT_AMBULATORY_CARE_PROVIDER_SITE_OTHER): Payer: BC Managed Care – PPO | Admitting: Obstetrics & Gynecology

## 2019-04-28 ENCOUNTER — Other Ambulatory Visit: Payer: Self-pay

## 2019-04-28 VITALS — BP 120/80 | Ht 63.0 in | Wt 102.0 lb

## 2019-04-28 DIAGNOSIS — N920 Excessive and frequent menstruation with regular cycle: Secondary | ICD-10-CM

## 2019-04-28 NOTE — Progress Notes (Signed)
Hysteroscopy with Minerva Endometrial Ablation  Operative Report   Place of Surgery: [facility] Pre-Operative Diagnosis: Menorrhagia Procedure: Hysteroscopy with Minerva Endometrial Ablation  Surgeon: Barnett Applebaum, MD  Anesthesia: Paracervical Block   Operative Technique:  The patient was prepped and draped in the usual manner for Hysteroscopy with Minerva. Patient had voided prior to coming to the OR. A short weighted speculum was placed into the vagina and the anterior lip of the cervix was grasped with a single tooth tenaculum. Deep intra-cervical block was placed with a mixture of Marcaine 0.5% (6 cc's) and Lidocaine 1% (6 cc's) by placing 3 cc's into each quadrant with a spinal needle. The endocervical canal was measured to 3.5 centimeters. The uterus was then sounded to 9 centimeters. The cervix was dilated to 8 mm with Hegar dilators. Hysteroscopy was then carried out using saline as the distention media.  Hysteroscopy revealed:   A normal endometrial cavity without perforation or myometrial damage. Both tubal ostia were visualized.   It was decided to proceed with the Minerva ablation. The device was set for the appropriate cavity length and was placed into the uterus and deployed. The width was noted on the device to be in the approved Green Zone. The cervical balloon was inflated and the cervical seal was confirmed by the Minerva Multiplex Controller. The foot pedal was then pressed and the 2-part Uterine Integrity Test (UIT) confirmed the integrity of the cavity. Upon completion of the UIT, the treatment cycle lasted 120 seconds. The device was unlocked and removed.   Repeat hysteroscopy revealed that all visual areas of active endometrium had been adequately treated. At this point the procedure is complete and the patient was awakened and in stable condition.   She will be allowed to return to her home, provided that she is doing well. She will be instructed to call to report any increased  bleeding, uncontrolled pain, fever, SOB, or any other significant change in her condition. She is given a postoperative instruction sheet and a follow up appointment for 2 weeks.   Barnett Applebaum, MD, [facility], [facility] 04/28/2019  8:08 AM

## 2019-04-28 NOTE — Patient Instructions (Signed)
    Endometrial Ablation Post-Procedural Instructions for Patient   You may experience mild to moderate cramping (like menstrual cramping) and pinkish watery discharge.  This may last approximately 2 to 3 weeks. Use pads, not tampons during this time.  No sexual activity for 3 weeks post procedure.  Follow up medications and directions for taking the medications are    NORCO 5/500 1 or 2 by mouth every 4 to 6 hours as needed IBUPROFEN 800 mg by mouth three times a day for the next two days PHENERGAN 25 mg by mouth q 6 hours for nausea       Call our office if you develop any of the following: ? Fever of 100.4 or greater  ? Worsening pelvic pain  ? Nausea  ? Vomiting  ? Greenish vaginal discharge with odor   Office phone number: [phone]   

## 2019-05-12 ENCOUNTER — Other Ambulatory Visit: Payer: Self-pay

## 2019-05-12 ENCOUNTER — Encounter: Payer: Self-pay | Admitting: Obstetrics & Gynecology

## 2019-05-12 ENCOUNTER — Ambulatory Visit (INDEPENDENT_AMBULATORY_CARE_PROVIDER_SITE_OTHER): Payer: BC Managed Care – PPO | Admitting: Obstetrics & Gynecology

## 2019-05-12 VITALS — Ht 63.0 in | Wt 101.0 lb

## 2019-05-12 DIAGNOSIS — N920 Excessive and frequent menstruation with regular cycle: Secondary | ICD-10-CM | POA: Diagnosis not present

## 2019-05-12 NOTE — Progress Notes (Signed)
Virtual Visit via Telephone Note  I connected with Kristina Mckenzie on 05/12/19 at  9:20 AM EDT by telephone and verified that I am speaking with the correct person using two identifiers.   I discussed the limitations, risks, security and privacy concerns of performing an evaluation and management service by telephone and the availability of in person appointments. I also discussed with the patient that there may be a patient responsible charge related to this service. The patient expressed understanding and agreed to proceed. She was at home and I was in my office.  History of Present Illness: Kristina Mckenzie is a 43 y.o. that had a endometrial ablation procedure due to menorrhagia approximately 2 weeks ago. Since that time, she states that she has not had irregular bleeding, and has not had pain or significant discharge. No associated sx's.  Feels well.  PMHx: She  has no past medical history on file. Also,  has a past surgical history that includes csection., family history includes Cancer in her maternal grandmother; Diabetes in her maternal grandmother.,  reports that she has been smoking. She has never used smokeless tobacco. She reports current drug use. Drug: Marijuana. She reports that she does not drink alcohol.  She currently has no medications in their medication list. Also, has No Known Allergies.  Review of Systems  All other systems reviewed and are negative.  Observations/Objective: No exam today, due to telephone eVisit due to [facility] virus restriction on elective visits and procedures.  Prior visits reviewed along with ultrasounds/labs as indicated.  Assessment and Plan: 1. Menorrhagia with regular cycle Monitor response to Endometrial Ablation procedure  Follow Up Instructions: As needed and for annual   I discussed the assessment and treatment plan with the patient. The patient was provided an opportunity to ask questions and all were answered. The patient agreed with the  plan and demonstrated an understanding of the instructions.   The patient was advised to call back or seek an in-person evaluation if the symptoms worsen or if the condition fails to improve as anticipated.  I provided 12 minutes of non-face-to-face time during this encounter.   Hoyt Koch, MD
